# Patient Record
Sex: Female | Born: 1995 | Hispanic: No | Marital: Single | Smoking: Never smoker
Health system: Southern US, Community
[De-identification: ages and names within clinical notes are randomized; demographics above are authoritative.]

## PROBLEM LIST (undated history)

## (undated) DIAGNOSIS — J45909 Unspecified asthma, uncomplicated: Secondary | ICD-10-CM

## (undated) DIAGNOSIS — A1801 Tuberculosis of spine: Secondary | ICD-10-CM

## (undated) HISTORY — PX: HERNIA REPAIR: SHX51

---

## 2014-01-10 ENCOUNTER — Emergency Department (HOSPITAL_COMMUNITY): Payer: Self-pay

## 2014-01-10 ENCOUNTER — Encounter (HOSPITAL_COMMUNITY): Payer: Self-pay | Admitting: Emergency Medicine

## 2014-01-10 ENCOUNTER — Emergency Department (HOSPITAL_COMMUNITY)
Admission: EM | Admit: 2014-01-10 | Discharge: 2014-01-10 | Disposition: A | Discharge status: Home / Self Care | Payer: Self-pay | Attending: Emergency Medicine | Admitting: Emergency Medicine

## 2014-01-10 DIAGNOSIS — R059 Cough, unspecified: Secondary | ICD-10-CM

## 2014-01-10 DIAGNOSIS — Z8619 Personal history of other infectious and parasitic diseases: Secondary | ICD-10-CM | POA: Insufficient documentation

## 2014-01-10 DIAGNOSIS — R0600 Dyspnea, unspecified: Secondary | ICD-10-CM

## 2014-01-10 DIAGNOSIS — R05 Cough: Secondary | ICD-10-CM | POA: Insufficient documentation

## 2014-01-10 DIAGNOSIS — J45901 Unspecified asthma with (acute) exacerbation: Secondary | ICD-10-CM | POA: Insufficient documentation

## 2014-01-10 HISTORY — DX: Unspecified asthma, uncomplicated: J45.909

## 2014-01-10 HISTORY — DX: Tuberculosis of spine: A18.01

## 2014-01-10 MED ORDER — ALBUTEROL SULFATE HFA 108 (90 BASE) MCG/ACT IN AERS
2.0000 | INHALATION_SPRAY | Freq: Once | RESPIRATORY_TRACT | Status: AC
  Administered 2014-01-10: 2 via RESPIRATORY_TRACT
  Filled 2014-01-10: qty 6.7

## 2014-01-10 MED ORDER — ACETAMINOPHEN 325 MG PO TABS
650.0000 mg | ORAL_TABLET | Freq: Once | ORAL | Status: DC
  Filled 2014-01-10: qty 2

## 2014-01-10 MED ORDER — ACETAMINOPHEN 325 MG PO TABS
650.0000 mg | ORAL_TABLET | Freq: Once | ORAL | Status: AC
  Administered 2014-01-10: 650 mg via ORAL

## 2014-01-10 NOTE — Discharge Instructions (Signed)
Cough, Adult  A cough is a reflex that helps clear your throat and airways. It can help heal the body or may be a reaction to an irritated airway. A cough may only last 2 or 3 weeks (acute) or may last more than 8 weeks (chronic).  CAUSES Acute cough:  Viral or bacterial infections. Chronic cough:  Infections.  Allergies.  Asthma.  Post-nasal drip.  Smoking.  Heartburn or acid reflux.  Some medicines.  Chronic lung problems (COPD).  Cancer. SYMPTOMS   Cough.  Fever.  Chest pain.  Increased breathing rate.  High-pitched whistling sound when breathing (wheezing).  Colored mucus that you cough up (sputum). TREATMENT   A bacterial cough may be treated with antibiotic medicine.  A viral cough must run its course and will not respond to antibiotics.  Your caregiver may recommend other treatments if you have a chronic cough. HOME CARE INSTRUCTIONS   Only take over-the-counter or prescription medicines for pain, discomfort, or fever as directed by your caregiver. Use cough suppressants only as directed by your caregiver.  Use a cold steam vaporizer or humidifier in your bedroom or home to help loosen secretions.  Sleep in a semi-upright position if your cough is worse at night.  Rest as needed.  Stop smoking if you smoke. SEEK IMMEDIATE MEDICAL CARE IF:   You have pus in your sputum.  Your cough starts to worsen.  You cannot control your cough with suppressants and are losing sleep.  You begin coughing up blood.  You have difficulty breathing.  You develop pain which is getting worse or is uncontrolled with medicine.  You have a fever. MAKE SURE YOU:   Understand these instructions.  Will watch your condition.  Will get help right away if you are not doing well or get worse. Document Released: 09/02/2010 Document Revised: 05/29/2011 Document Reviewed: 09/02/2010 [employer] Patient Information 2015 [employer]. This information is not intended  to replace advice given to you by your health care provider. Make sure you discuss any questions you have with your health care provider.  

## 2014-01-10 NOTE — ED Notes (Addendum)
Medicated for 7/10 headache "from coughing". Feels sob again, lung sounds clear O2 sats 98-100% on room air. Pt reasurred

## 2014-01-10 NOTE — ED Notes (Signed)
RT paged.

## 2014-01-10 NOTE — ED Notes (Signed)
Pt. Reports shortness of breath starting this morning. Pt. Reports increased shortness of breath tonight. Cough present. No acute distress noted.

## 2014-01-10 NOTE — ED Provider Notes (Signed)
CSN: [id number]     Arrival date & time 01/10/14  0125 History   First MD Initiated Contact with Patient 01/10/14 [phone]     Chief Complaint  Patient presents with  . Shortness of Breath     Patient is a 18 y.o. female presenting with shortness of breath. The history is provided by the patient.  Shortness of Breath Severity:  Moderate Onset quality:  Gradual Timing:  Intermittent Progression:  Unchanged Chronicity:  New Relieved by:  Nothing Worsened by:  Nothing tried Associated symptoms: cough   Associated symptoms: no chest pain, no fever and no hemoptysis   pt reports starting yesterday she began having dry cough and shortness of breath She reports distant h/o asthma and this feels similar to those episodes No active CP No fever/vomiting No sore throat No LE edema/pain  [facility] She denies h/o DVT/PE She does not take estrogen products   Past Medical History  Diagnosis Date  . Pott's disease   . Asthma    Past Surgical History  Procedure Laterality Date  . Hernia repair     No family history on file. History  Substance Use Topics  . Smoking status: Never Smoker   . Smokeless tobacco: Not on file  . Alcohol Use: No   OB History   Grav Para Term Preterm Abortions TAB SAB Ect Mult Living                 Review of Systems  Constitutional: Negative for fever.  Respiratory: Positive for cough and shortness of breath. Negative for hemoptysis.   Cardiovascular: Negative for chest pain.  All other systems reviewed and are negative.     Allergies  Review of patient's allergies indicates no known allergies.  Home Medications   Prior to Admission medications   Not on File   BP 112/78  Pulse 105  Temp(Src) 98 F (36.7 C) (Oral)  Resp 22  Ht 5\' 2"  (1.575 m)  Wt 116 lb (52.617 kg)  BMI 21.21 kg/m2  SpO2 97%  LMP 12/20/2013 Physical Exam CONSTITUTIONAL: Well developed/well nourished HEAD:  Normocephalic/atraumatic EYES: EOMI/PERRL ENMT: Mucous membranes moist NECK: supple no meningeal signs SPINE:entire spine nontender CV: S1/S2 noted, no murmurs/rubs/gallops noted LUNGS: scattered wheeze noted, no apparent distress ABDOMEN: soft, nontender, no rebound or guarding GU:no cva tenderness NEURO: Pt is awake/alert, moves all extremitiesx4 EXTREMITIES: pulses normal, full ROM SKIN: warm, color normal PSYCH:anxious  ED Course  Procedures  Imaging Review Dg Chest 2 View  01/10/2014   CLINICAL DATA:  Shortness of breath starting last night, increasing through the day. Chest pain. Body aches, fever. Cough.  EXAM: CHEST  2 VIEW  COMPARISON:  None.  FINDINGS: The heart size and mediastinal contours are within normal limits. Both lungs are clear. The visualized skeletal structures are unremarkable.  IMPRESSION: No active cardiopulmonary disease.   Electronically Signed   By: Charlett NoseKevin  Dover M.D.   On: 01/10/2014 02:44     EKG Interpretation   Date/Time:  Saturday January 10 2014 01:43:46 EDT Ventricular Rate:  88 PR Interval:  146 QRS Duration: 71 QT Interval:  318 QTC Calculation: 385 R Axis:   73 Text Interpretation:  Sinus rhythm Non-specific ST-t changes No previous  ECGs available Confirmed by Bebe ShaggyWICKLINE  MD, Anneke Cundy ([id number]) on 01/10/2014  2:14:55 AM     Pt well appearing, no distress, coughs frequently during exam with some wheeze Felt improved after albuterol She is laughing,  no distress, talking to family I have low suspicion for PE at this time given history/exam We discussed strict return precautions  MDM   Final diagnoses:  Cough    Nursing notes including past medical history and social history reviewed and considered in documentation xrays reviewed and considered     Joya Gaskinsonald W [place] Warriner, MD 01/10/14 [phone]

## 2015-05-21 IMAGING — CR DG CHEST 2V
2 series · 2 of 2 positions shown · non-contrast
Comparison: None.

CLINICAL DATA: Shortness of breath starting last night, increasing
through the day. Chest pain. Body aches, fever. Cough.

EXAM:
CHEST  2 VIEW

[view not recorded (1 of 2)]
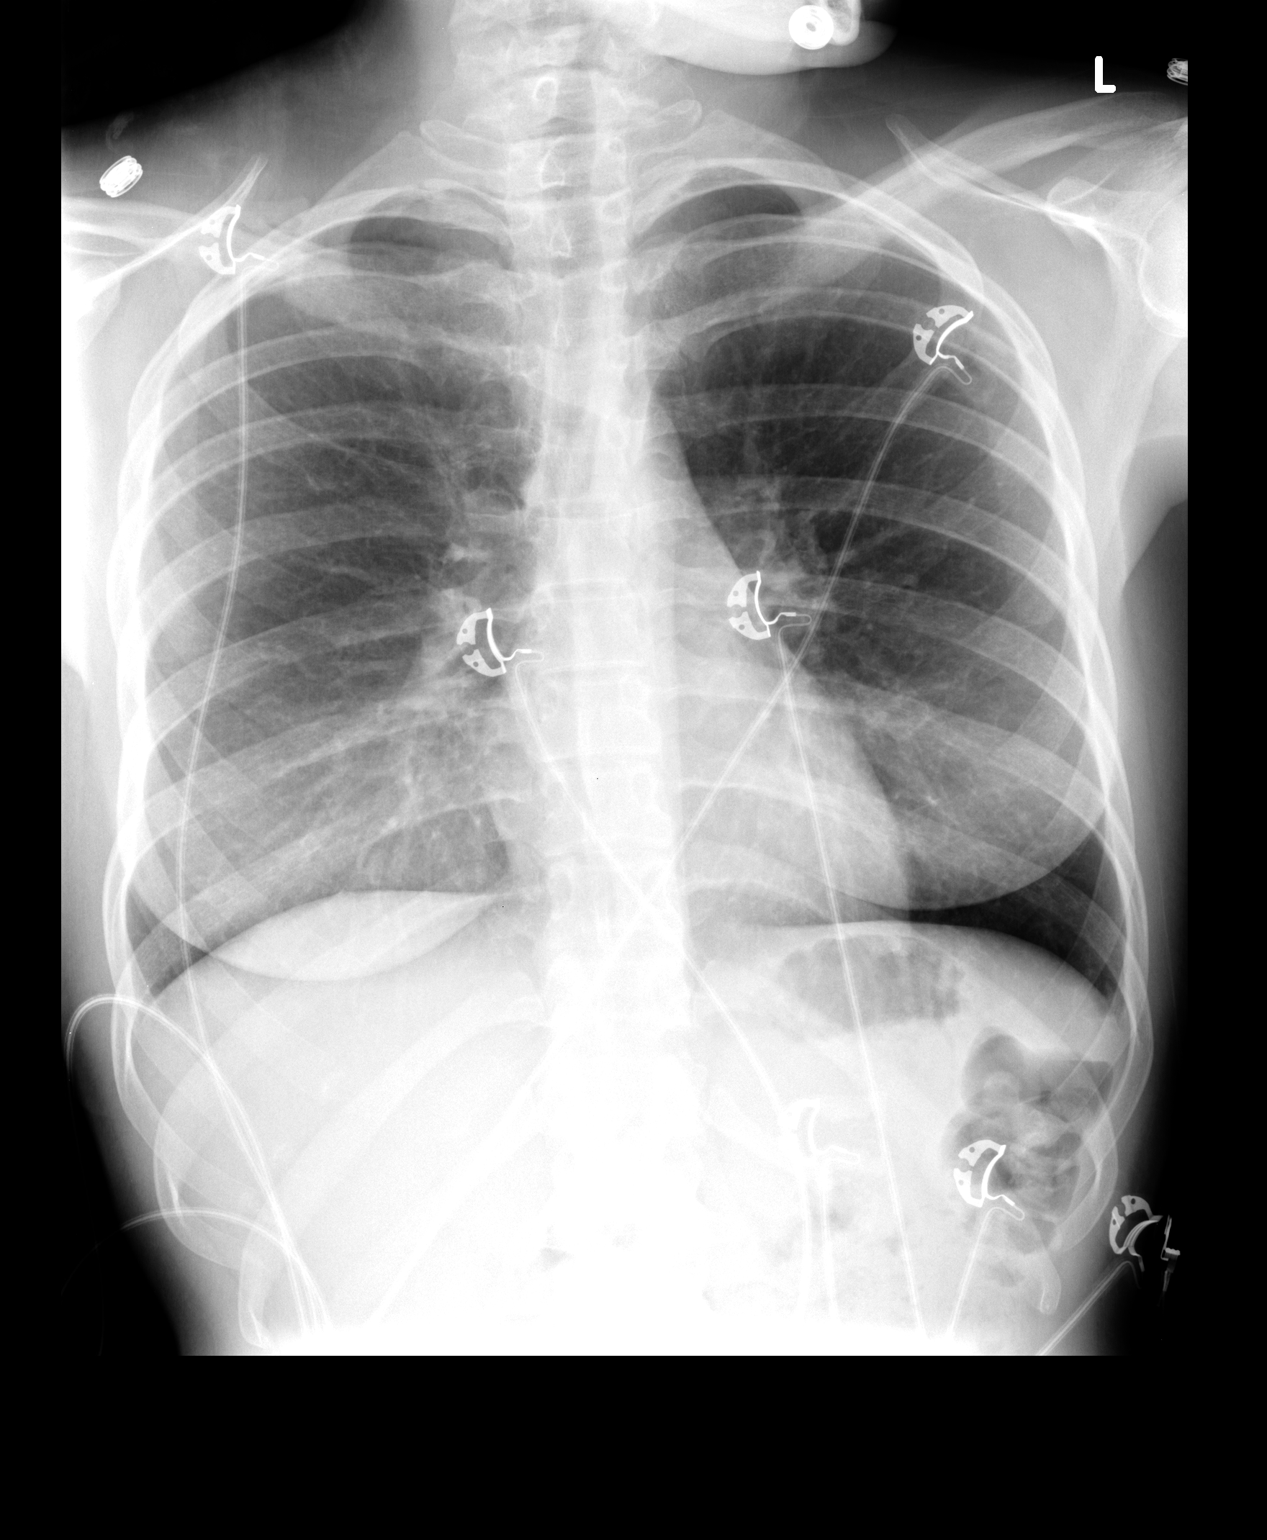

[view not recorded (2 of 2)]
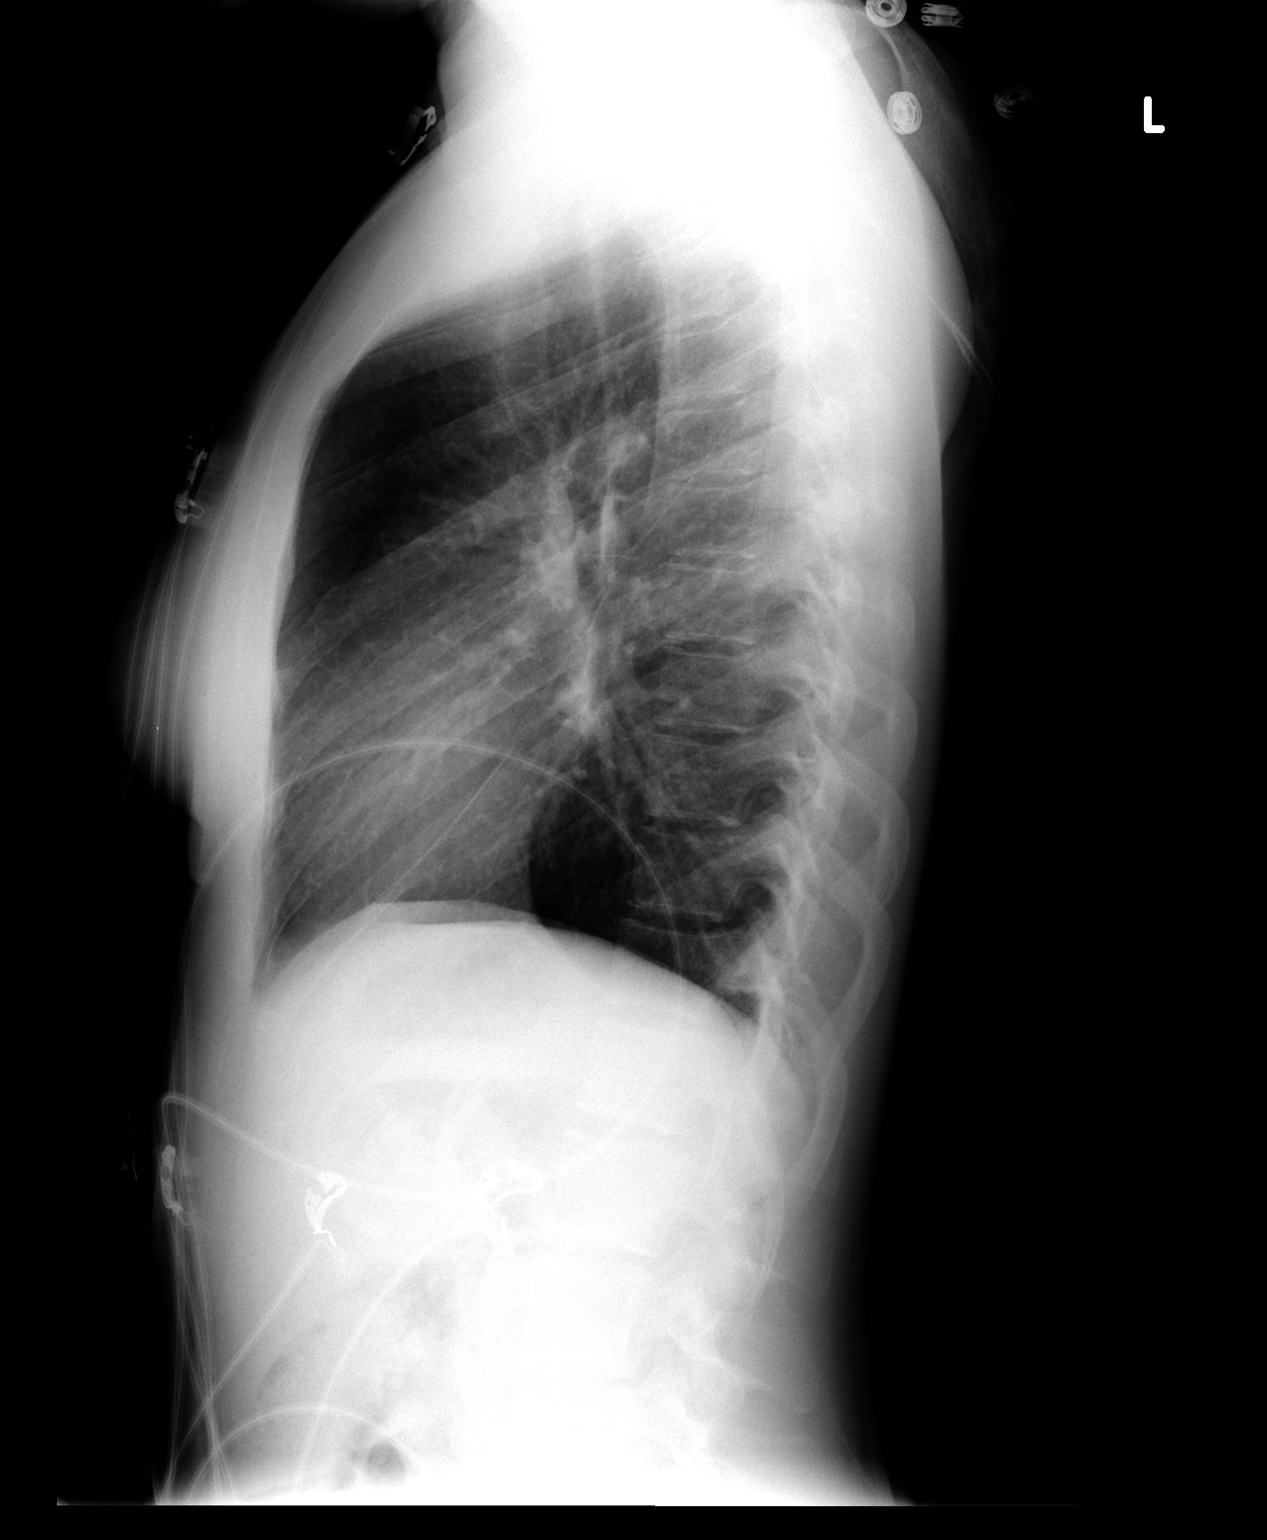

[2 of 2 positions shown; findings below may reference images not displayed]

FINDINGS: The heart size and mediastinal contours are within normal limits.
Both lungs are clear. The visualized skeletal structures are
unremarkable.
IMPRESSION: No active cardiopulmonary disease.
